# Patient Record
Sex: Male | Born: 1985 | Race: Asian | Hispanic: No | Marital: Single | Smoking: Never smoker
Health system: Southern US, Community
[De-identification: ages and names within clinical notes are randomized; demographics above are authoritative.]

## PROBLEM LIST (undated history)

## (undated) HISTORY — PX: PERIPHERAL ARTERIAL STENT GRAFT: SHX2220

---

## 2005-03-07 ENCOUNTER — Emergency Department (HOSPITAL_COMMUNITY): Admission: EM | Admit: 2005-03-07 | Discharge: 2005-03-07 | Payer: Self-pay | Admitting: Emergency Medicine

## 2007-06-14 ENCOUNTER — Emergency Department (HOSPITAL_COMMUNITY): Admission: EM | Admit: 2007-06-14 | Discharge: 2007-06-14 | Payer: Self-pay | Admitting: Emergency Medicine

## 2016-06-08 ENCOUNTER — Emergency Department (HOSPITAL_COMMUNITY)
Admission: EM | Admit: 2016-06-08 | Discharge: 2016-06-09 | Disposition: A | Discharge status: Home / Self Care | Payer: BLUE CROSS/BLUE SHIELD | Attending: Emergency Medicine | Admitting: Emergency Medicine

## 2016-06-08 DIAGNOSIS — F10120 Alcohol abuse with intoxication, uncomplicated: Secondary | ICD-10-CM | POA: Diagnosis not present

## 2016-06-08 DIAGNOSIS — S0240EA Zygomatic fracture, right side, initial encounter for closed fracture: Secondary | ICD-10-CM | POA: Diagnosis not present

## 2016-06-08 DIAGNOSIS — S0993XA Unspecified injury of face, initial encounter: Secondary | ICD-10-CM | POA: Diagnosis present

## 2016-06-08 DIAGNOSIS — R791 Abnormal coagulation profile: Secondary | ICD-10-CM | POA: Diagnosis not present

## 2016-06-08 DIAGNOSIS — N1330 Unspecified hydronephrosis: Secondary | ICD-10-CM

## 2016-06-08 DIAGNOSIS — Y939 Activity, unspecified: Secondary | ICD-10-CM | POA: Diagnosis not present

## 2016-06-08 DIAGNOSIS — S0990XA Unspecified injury of head, initial encounter: Secondary | ICD-10-CM | POA: Insufficient documentation

## 2016-06-08 DIAGNOSIS — Z23 Encounter for immunization: Secondary | ICD-10-CM | POA: Diagnosis not present

## 2016-06-08 DIAGNOSIS — Y999 Unspecified external cause status: Secondary | ICD-10-CM | POA: Insufficient documentation

## 2016-06-08 DIAGNOSIS — S0181XA Laceration without foreign body of other part of head, initial encounter: Secondary | ICD-10-CM

## 2016-06-08 DIAGNOSIS — T1490XA Injury, unspecified, initial encounter: Secondary | ICD-10-CM

## 2016-06-08 DIAGNOSIS — F10920 Alcohol use, unspecified with intoxication, uncomplicated: Secondary | ICD-10-CM

## 2016-06-08 DIAGNOSIS — S01111A Laceration without foreign body of right eyelid and periocular area, initial encounter: Secondary | ICD-10-CM | POA: Insufficient documentation

## 2016-06-08 DIAGNOSIS — Y92410 Unspecified street and highway as the place of occurrence of the external cause: Secondary | ICD-10-CM | POA: Insufficient documentation
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
## 2016-06-09 ENCOUNTER — Emergency Department (HOSPITAL_COMMUNITY): Payer: BLUE CROSS/BLUE SHIELD

## 2016-06-09 ENCOUNTER — Encounter (HOSPITAL_COMMUNITY): Payer: Self-pay

## 2016-06-09 DIAGNOSIS — S0240EA Zygomatic fracture, right side, initial encounter for closed fracture: Secondary | ICD-10-CM | POA: Diagnosis not present

## 2016-06-09 LAB — COMPREHENSIVE METABOLIC PANEL
ALBUMIN: 4.7 g/dL (ref 3.5–5.0)
ALT: 23 U/L (ref 17–63)
AST: 26 U/L (ref 15–41)
Alkaline Phosphatase: 73 U/L (ref 38–126)
Anion gap: 10 (ref 5–15)
BUN: 10 mg/dL (ref 6–20)
CHLORIDE: 109 mmol/L (ref 101–111)
CO2: 22 mmol/L (ref 22–32)
Calcium: 9.3 mg/dL (ref 8.9–10.3)
Creatinine, Ser: 1 mg/dL (ref 0.61–1.24)
GFR calc Af Amer: >60 mL/min (ref >60)
GLUCOSE: 110 mg/dL — AB (ref 65–99)
POTASSIUM: 3.4 mmol/L — AB (ref 3.5–5.1)
SODIUM: 141 mmol/L (ref 135–145)
Total Bilirubin: 0.5 mg/dL (ref 0.3–1.2)
Total Protein: 7.4 g/dL (ref 6.5–8.1)

## 2016-06-09 LAB — I-STAT CHEM 8, ED
BUN: 12 mg/dL (ref 6–20)
Calcium, Ion: 1.02 mmol/L — ABNORMAL LOW (ref 1.15–1.40)
Chloride: 107 mmol/L (ref 101–111)
Creatinine, Ser: 1.2 mg/dL (ref 0.61–1.24)
Glucose, Bld: 107 mg/dL — ABNORMAL HIGH (ref 65–99)
HEMATOCRIT: 50 % (ref 39.0–52.0)
HEMOGLOBIN: 17 g/dL (ref 13.0–17.0)
Potassium: 3.4 mmol/L — ABNORMAL LOW (ref 3.5–5.1)
SODIUM: 144 mmol/L (ref 135–145)
TCO2: 23 mmol/L (ref 0–100)

## 2016-06-09 LAB — PROTIME-INR
INR: 0.92
Prothrombin Time: 12.3 seconds (ref 11.4–15.2)

## 2016-06-09 LAB — CBC
HEMATOCRIT: 48.8 % (ref 39.0–52.0)
Hemoglobin: 16.3 g/dL (ref 13.0–17.0)
MCH: 28.7 pg (ref 26.0–34.0)
MCHC: 33.4 g/dL (ref 30.0–36.0)
MCV: 85.9 fL (ref 78.0–100.0)
Platelets: 254 10*3/uL (ref 150–400)
RBC: 5.68 MIL/uL (ref 4.22–5.81)
RDW: 13.5 % (ref 11.5–15.5)
WBC: 9.4 10*3/uL (ref 4.0–10.5)

## 2016-06-09 LAB — SAMPLE TO BLOOD BANK

## 2016-06-09 LAB — ETHANOL: Alcohol, Ethyl (B): 201 mg/dL — ABNORMAL HIGH (ref <5)

## 2016-06-09 MED ORDER — TETANUS-DIPHTH-ACELL PERTUSSIS 5-2.5-18.5 LF-MCG/0.5 IM SUSP
0.5000 mL | Freq: Once | INTRAMUSCULAR | Status: AC
  Administered 2016-06-09: 0.5 mL via INTRAMUSCULAR

## 2016-06-09 MED ORDER — LIDOCAINE-EPINEPHRINE (PF) 2 %-1:200000 IJ SOLN
10.0000 mL | Freq: Once | INTRAMUSCULAR | Status: AC
  Administered 2016-06-09: 10 mL
  Filled 2016-06-09: qty 20

## 2016-06-09 MED ORDER — TETANUS-DIPHTH-ACELL PERTUSSIS 5-2.5-18.5 LF-MCG/0.5 IM SUSP
INTRAMUSCULAR | Status: AC
  Filled 2016-06-09: qty 0.5

## 2016-06-09 MED ORDER — HYDROCODONE-ACETAMINOPHEN 5-325 MG PO TABS: 1.0000 | ORAL_TABLET | ORAL | 0 refills | Status: DC | PRN

## 2016-06-09 MED ORDER — SODIUM CHLORIDE 0.9 % IV BOLUS (SEPSIS)
500.0000 mL | Freq: Once | INTRAVENOUS | Status: AC
  Administered 2016-06-09: 500 mL via INTRAVENOUS

## 2016-06-09 MED ORDER — SODIUM CHLORIDE 0.9 % IV BOLUS (SEPSIS)
125.0000 mL | Freq: Once | INTRAVENOUS | Status: AC
  Administered 2016-06-09: 125 mL via INTRAVENOUS

## 2016-06-09 MED ORDER — IOPAMIDOL (ISOVUE-300) INJECTION 61%
INTRAVENOUS | Status: AC
  Administered 2016-06-09: 100 mL
  Filled 2016-06-09: qty 100

## 2016-06-09 NOTE — Progress Notes (Signed)
Responded to page to Ed. Sat with pt's mom, who spoke mainly [place], and neighbor who interpreted. Pt is mom's only child, and she was quite concerned about him. Another young man joined interpreter later. Provided emotional/spiritual support to all. Chaplain available for follow-up.    06/09/16 0700  Clinical Encounter Type  Visited With Patient and family together  Visit Type Initial;Spiritual support;ED  Referral From Nurse  Spiritual Encounters  Spiritual Needs Emotional  Stress Factors  Patient Stress Factors Health changes  Family Stress Factors Family relationships;Health changes

## 2016-06-09 NOTE — ED Provider Notes (Signed)
..  Laceration Repair Date/Time: 06/09/2016 3:06 AM Performed by: Dierdre ForthMUTHERSBAUGH, Haasini Patnaude Authorized by: Dierdre ForthMUTHERSBAUGH, Shyenne Maggard   Consent:    Consent obtained:  Verbal   Consent given by:  Patient   Risks discussed:  Infection and pain   Alternatives discussed:  No treatment and observation Anesthesia (see MAR for exact dosages):    Anesthesia method:  Local infiltration   Local anesthetic:  Lidocaine 2% WITH epi Laceration details:    Location:  Face   Face location:  R eyebrow   Length (cm):  3.5 Repair type:    Repair type:  Simple Exploration:    Hemostasis achieved with:  Direct pressure and epinephrine   Wound exploration: entire depth of wound probed and visualized     Contaminated: yes   Treatment:    Area cleansed with:  Betadine and saline   Amount of cleaning:  Standard   Irrigation solution:  Sterile water   Irrigation method:  Syringe Skin repair:    Repair method:  Sutures   Suture size:  5-0   Wound skin closure material used: vicryl rapide.   Suture technique:  Simple interrupted   Number of sutures:  4 Approximation:    Approximation:  Close   Vermilion border: well-aligned   Post-procedure details:    Dressing:  Open (no dressing)   Patient tolerance of procedure:  Tolerated well, no immediate complications      Dierdre ForthHannah Marrion Accomando, PA-C 06/09/16 [id number]    Linwood DibblesJon Knapp, MD 06/09/16 [phone]

## 2016-06-09 NOTE — ED Notes (Signed)
Pt transports to CT  

## 2016-06-09 NOTE — ED Notes (Signed)
Per ENT pt may go home
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
## 2016-06-09 NOTE — ED Notes (Signed)
Family updated as to patient's status.
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
## 2023-02-26 DIAGNOSIS — S51811A Laceration without foreign body of right forearm, initial encounter: Secondary | ICD-10-CM | POA: Diagnosis not present

## 2023-02-27 DIAGNOSIS — S66122A Laceration of flexor muscle, fascia and tendon of right middle finger at wrist and hand level, initial encounter: Secondary | ICD-10-CM | POA: Diagnosis not present

## 2023-02-27 DIAGNOSIS — S66124A Laceration of flexor muscle, fascia and tendon of right ring finger at wrist and hand level, initial encounter: Secondary | ICD-10-CM | POA: Diagnosis not present

## 2023-02-27 DIAGNOSIS — S66126A Laceration of flexor muscle, fascia and tendon of right little finger at wrist and hand level, initial encounter: Secondary | ICD-10-CM | POA: Diagnosis not present

## 2023-02-27 DIAGNOSIS — S66128A Laceration of flexor muscle, fascia and tendon of other finger at wrist and hand level, initial encounter: Secondary | ICD-10-CM | POA: Diagnosis not present

## 2023-02-27 DIAGNOSIS — S66120A Laceration of flexor muscle, fascia and tendon of right index finger at wrist and hand level, initial encounter: Secondary | ICD-10-CM | POA: Diagnosis not present

## 2023-02-27 DIAGNOSIS — I82621 Acute embolism and thrombosis of deep veins of right upper extremity: Secondary | ICD-10-CM | POA: Diagnosis not present

## 2023-02-27 DIAGNOSIS — S65111S Laceration of radial artery at wrist and hand level of right arm, sequela: Secondary | ICD-10-CM | POA: Diagnosis not present

## 2023-04-24 DIAGNOSIS — M79641 Pain in right hand: Secondary | ICD-10-CM | POA: Diagnosis not present

## 2023-04-29 DIAGNOSIS — M79641 Pain in right hand: Secondary | ICD-10-CM | POA: Diagnosis not present

## 2023-05-01 DIAGNOSIS — M79641 Pain in right hand: Secondary | ICD-10-CM | POA: Diagnosis not present

## 2023-05-06 DIAGNOSIS — M79641 Pain in right hand: Secondary | ICD-10-CM | POA: Diagnosis not present

## 2023-05-08 DIAGNOSIS — M79641 Pain in right hand: Secondary | ICD-10-CM | POA: Diagnosis not present

## 2023-05-15 DIAGNOSIS — M79641 Pain in right hand: Secondary | ICD-10-CM | POA: Diagnosis not present

## 2023-07-20 ENCOUNTER — Other Ambulatory Visit: Payer: Self-pay

## 2023-07-20 ENCOUNTER — Emergency Department (HOSPITAL_COMMUNITY)
Admission: EM | Admit: 2023-07-20 | Discharge: 2023-07-20 | Disposition: A | Discharge status: Home / Self Care | Payer: Medicaid Other | Attending: Emergency Medicine | Admitting: Emergency Medicine

## 2023-07-20 ENCOUNTER — Emergency Department (HOSPITAL_COMMUNITY): Payer: Medicaid Other

## 2023-07-20 ENCOUNTER — Encounter (HOSPITAL_COMMUNITY): Payer: Self-pay | Admitting: *Deleted

## 2023-07-20 DIAGNOSIS — D72829 Elevated white blood cell count, unspecified: Secondary | ICD-10-CM | POA: Diagnosis not present

## 2023-07-20 DIAGNOSIS — R051 Acute cough: Secondary | ICD-10-CM | POA: Diagnosis not present

## 2023-07-20 DIAGNOSIS — R059 Cough, unspecified: Secondary | ICD-10-CM | POA: Diagnosis present

## 2023-07-20 DIAGNOSIS — R079 Chest pain, unspecified: Secondary | ICD-10-CM | POA: Diagnosis not present

## 2023-07-20 DIAGNOSIS — R0981 Nasal congestion: Secondary | ICD-10-CM | POA: Diagnosis not present

## 2023-07-20 LAB — BASIC METABOLIC PANEL
Anion gap: 7 (ref 5–15)
BUN: 10 mg/dL (ref 6–20)
CO2: 27 mmol/L (ref 22–32)
Calcium: 8.7 mg/dL — ABNORMAL LOW (ref 8.9–10.3)
Chloride: 107 mmol/L (ref 98–111)
Creatinine, Ser: 1.23 mg/dL (ref 0.61–1.24)
GFR, Estimated: >60 mL/min (ref >60)
Glucose, Bld: 93 mg/dL (ref 70–99)
Potassium: 3.5 mmol/L (ref 3.5–5.1)
Sodium: 141 mmol/L (ref 135–145)

## 2023-07-20 LAB — TROPONIN I (HIGH SENSITIVITY): Troponin I (High Sensitivity): 6 ng/L (ref <18)

## 2023-07-20 LAB — CBC
HCT: 45.4 % (ref 39.0–52.0)
Hemoglobin: 14.6 g/dL (ref 13.0–17.0)
MCH: 26.4 pg (ref 26.0–34.0)
MCHC: 32.2 g/dL (ref 30.0–36.0)
MCV: 82.2 fL (ref 80.0–100.0)
Platelets: 307 10*3/uL (ref 150–400)
RBC: 5.52 MIL/uL (ref 4.22–5.81)
RDW: 14.9 % (ref 11.5–15.5)
WBC: 11.7 10*3/uL — ABNORMAL HIGH (ref 4.0–10.5)
nRBC: 0 % (ref 0.0–0.2)

## 2023-07-20 MED ORDER — ALBUTEROL SULFATE HFA 108 (90 BASE) MCG/ACT IN AERS: 1.0000 | INHALATION_SPRAY | Freq: Four times a day (QID) | RESPIRATORY_TRACT | 0 refills | Status: DC | PRN

## 2023-07-20 MED ORDER — FLUTICASONE PROPIONATE 50 MCG/ACT NA SUSP: 2.0000 | Freq: Every day | NASAL | 2 refills | Status: DC

## 2023-07-20 NOTE — Discharge Instructions (Addendum)
Today you were seen for an upper respiratory infection.  Please pick up your medications and take as prescribed.  Please follow-up with your PCP if your symptoms persist for further evaluation and treatment.  Thank you for letting us treat you today. After reviewing your labs and imaging, I feel you are safe to go home. Please follow up with your PCP in the next several days and provide them with your records from this visit. Return to the Emergency Room if pain becomes severe or symptoms worsen.

## 2023-07-20 NOTE — ED Provider Notes (Signed)
[place] EMERGENCY DEPARTMENT AT [facility] Provider Note   CSN: [id number] Arrival date & time: 07/20/23  1821     History  Chief Complaint  Patient presents with   Chest Pain    Willie Riley is a 37 y.o. male no significant past medical history presents today for cough and congestion x 1 week.  Patient states that he was seen last week at urgent care and tested negative for COVID, flu, RSV, strep.  He was given a steroid taper and cough medication with minimal relief.  Patient denies fever, shortness of breath, chills, body aches, abdominal pain, nausea, vomiting, or diarrhea   Chest Pain Associated symptoms: cough        Home Medications Prior to Admission medications   Medication Sig Start Date End Date Taking? Authorizing Provider  albuterol (VENTOLIN HFA) 108 (90 Base) MCG/ACT inhaler Inhale 1-2 puffs into the lungs every 6 (six) hours as needed for wheezing or shortness of breath. 07/20/23  Yes Dolphus Jenny, PA-C  fluticasone (FLONASE) 50 MCG/ACT nasal spray Place 2 sprays into both nostrils daily. 07/20/23  Yes Dolphus Jenny, PA-C  HYDROcodone-acetaminophen (NORCO/VICODIN) 5-325 MG tablet Take 1 tablet by mouth every 4 (four) hours as needed. 06/09/16   Linwood Dibbles, MD      Allergies    Patient has no known allergies.    Review of Systems   Review of Systems  HENT:  Positive for congestion.   Respiratory:  Positive for cough.     Physical Exam Updated Vital Signs BP (!) 169/112   Pulse 98   Temp 97.7 F (36.5 C) (Oral)   Resp 18   Ht 5\' 6"  (1.676 m)   Wt 59 kg   SpO2 98%   BMI 20.99 kg/m  Physical Exam Vitals and nursing note reviewed.  Constitutional:      General: He is not in acute distress.    Appearance: He is well-developed.  HENT:     Head: Normocephalic and atraumatic.     Right Ear: External ear normal.     Left Ear: External ear normal.     Nose: Congestion present.     Mouth/Throat:     Mouth: Mucous membranes are moist.      Tonsils: No tonsillar exudate or tonsillar abscesses.  Eyes:     Extraocular Movements: Extraocular movements intact.     Conjunctiva/sclera: Conjunctivae normal.  Cardiovascular:     Rate and Rhythm: Normal rate and regular rhythm.     Heart sounds: No murmur heard. Pulmonary:     Effort: Pulmonary effort is normal. No respiratory distress.     Breath sounds: Normal breath sounds.  Abdominal:     Palpations: Abdomen is soft.     Tenderness: There is no abdominal tenderness.  Musculoskeletal:        General: No swelling.     Cervical back: Neck supple.  Skin:    General: Skin is warm and dry.     Capillary Refill: Capillary refill takes less than 2 seconds.  Neurological:     General: No focal deficit present.     Mental Status: He is alert.  Psychiatric:        Mood and Affect: Mood normal.     ED Results / Procedures / Treatments   Labs (all labs ordered are listed, but only abnormal results are displayed) Labs Reviewed  BASIC METABOLIC PANEL - Abnormal; Notable for the following components:      Result  Value   Calcium 8.7 (*)    All other components within normal limits  CBC - Abnormal; Notable for the following components:   WBC 11.7 (*)    All other components within normal limits  TROPONIN I (HIGH SENSITIVITY)  TROPONIN I (HIGH SENSITIVITY)    EKG None  Radiology DG Chest 2 View  Result Date: 07/20/2023 CLINICAL DATA:  Cough, chest pain. EXAM: CHEST - 2 VIEW COMPARISON:  June 08, 2016. FINDINGS: The heart size and mediastinal contours are within normal limits. Both lungs are clear. The visualized skeletal structures are unremarkable. IMPRESSION: No active cardiopulmonary disease. Electronically Signed   By: Lupita Raider M.D.   On: 07/20/2023 19:11    Procedures Procedures    Medications Ordered in ED Medications - No data to display  ED Course/ Medical Decision Making/ A&P                                 Medical Decision Making Amount  and/or Complexity of Data Reviewed Labs: ordered. Radiology: ordered.  Risk Prescription drug management.   This patient presents to the ED with chief complaint(s) of cough and congestion with pertinent past medical history of none which further complicates the presenting complaint. The complaint involves an extensive differential diagnosis and also carries with it a high risk of complications and morbidity.    The differential diagnosis includes upper respiratory infection, bronchitis, pneumonia  Additional history obtained: Additional history obtained from family  ED Course and Reassessment:   Independent labs interpretation:  The following labs were independently interpreted:  CBC: Leukocytosis at 11.7 BMP: mild hypocalcemia Troponin: 6 EKG: Normal sinus rhythm  Independent visualization of imaging: - I independently visualized the following imaging with scope of interpretation limited to determining acute life threatening conditions related to emergency care: Chest x-ray, which revealed no active cardiopulmonary disease  Consultation: - Consulted or discussed management/test interpretation w/ external professional: None  Consideration for admission or further workup: Considered for admission or further workup however patient's vital signs, physical exam, labs, and imaging have all been reassuring.  Patient likely has upper respiratory infection or bronchitis.  Patient will be treated outpatient with Flonase for nasal congestion and albuterol inhaler as needed.         Final Clinical Impression(s) / ED Diagnoses Final diagnoses:  Acute cough    Rx / DC Orders ED Discharge Orders          Ordered    fluticasone (FLONASE) 50 MCG/ACT nasal spray  Daily        07/20/23 2113    albuterol (VENTOLIN HFA) 108 (90 Base) MCG/ACT inhaler  Every 6 hours PRN        07/20/23 2113              Dolphus Jenny, PA-C 07/20/23 2122    Melene Plan, DO 07/20/23 2126

## 2023-07-20 NOTE — ED Triage Notes (Signed)
The ucc did a covid test and flu tests that were negative

## 2023-07-20 NOTE — ED Triage Notes (Signed)
The pt is c/o a cold chest congestion for one week he was seen at  [facility] last week and he was given some meds he is no better

## 2023-08-25 ENCOUNTER — Encounter: Payer: Self-pay | Admitting: *Deleted

## 2023-08-25 ENCOUNTER — Emergency Department: Payer: Medicaid Other

## 2023-08-25 ENCOUNTER — Other Ambulatory Visit: Payer: Self-pay

## 2023-08-25 DIAGNOSIS — R059 Cough, unspecified: Secondary | ICD-10-CM | POA: Diagnosis present

## 2023-08-25 DIAGNOSIS — Z720 Tobacco use: Secondary | ICD-10-CM | POA: Diagnosis not present

## 2023-08-25 DIAGNOSIS — Z20822 Contact with and (suspected) exposure to covid-19: Secondary | ICD-10-CM | POA: Diagnosis not present

## 2023-08-25 DIAGNOSIS — R052 Subacute cough: Secondary | ICD-10-CM | POA: Insufficient documentation

## 2023-08-25 LAB — RESP PANEL BY RT-PCR (RSV, FLU A&B, COVID)  RVPGX2
Influenza A by PCR: NEGATIVE
Influenza B by PCR: NEGATIVE
Resp Syncytial Virus by PCR: NEGATIVE
SARS Coronavirus 2 by RT PCR: NEGATIVE

## 2023-08-25 NOTE — ED Triage Notes (Addendum)
 Pt ambulatory to triage.  Pt has a cough for 1 month.  No fever.  Pt taking otc meds without relief.  Pt alert.  Speech clear.  Pt states he ran out of inhaler 2 days ago.

## 2023-08-26 ENCOUNTER — Emergency Department
Admission: EM | Admit: 2023-08-26 | Discharge: 2023-08-26 | Disposition: A | Discharge status: Home / Self Care | Payer: Medicaid Other | Attending: Emergency Medicine | Admitting: Emergency Medicine

## 2023-08-26 DIAGNOSIS — R052 Subacute cough: Secondary | ICD-10-CM

## 2023-08-26 MED ORDER — HYDROCOD POLI-CHLORPHE POLI ER 10-8 MG/5ML PO SUER
5.0000 mL | Freq: Once | ORAL | Status: AC
  Administered 2023-08-26: 5 mL via ORAL
  Filled 2023-08-26: qty 5

## 2023-08-26 MED ORDER — ALBUTEROL SULFATE HFA 108 (90 BASE) MCG/ACT IN AERS: 1.0000 | INHALATION_SPRAY | RESPIRATORY_TRACT | 1 refills | Status: DC | PRN

## 2023-08-26 MED ORDER — HYDROCOD POLI-CHLORPHE POLI ER 10-8 MG/5ML PO SUER: 5.0000 mL | Freq: Two times a day (BID) | ORAL | 0 refills | Status: DC | PRN

## 2023-08-26 NOTE — ED Provider Notes (Signed)
 [facility] Provider Note    Event Date/Time   First MD Initiated Contact with Patient 08/26/23 0018     (approximate)   History   Cough   HPI Willie Riley is a 38 y.o. male with no known medical issues but who has a tobacco use history though ASAP smoking about 5 months ago.  He presents for evaluation of persistent cough that has been going on about 2 months.  He said that it seems little bit worse recently and today he went jogging but had multiple coughing spells afterwards.  He does not have a primary care physician or pulmonologist.  He has no diagnoses in particular.  He has been to an urgent care a couple of times and went to the [facility] emergency department about a month ago and was told there was nothing in particular that was wrong.  He and his wife are frustrated because the symptoms have persisted.  He denies fever, chills, nausea, vomiting, chest pain, abdominal pain.  The main symptom is the cough which is worse at night.  He said that he takes Mucinex regularly but is unclear if it helps.  He was given a course of prednisone at the urgent care but he does not think that made a difference.  The albuterol  inhaler that he was prescribed did help but he has run out.     Physical Exam   Triage Vital Signs: ED Triage Vitals  Encounter Vitals Group     BP 08/25/23 2134 (!) 141/111     Systolic BP Percentile --      Diastolic BP Percentile --      Pulse Rate 08/25/23 2134 (!) 102     Resp 08/25/23 2134 18     Temp 08/25/23 2134 97.8 F (36.6 C)     Temp Source 08/26/23 0058 Oral     SpO2 08/25/23 2134 98 %     Weight 08/25/23 2133 70.3 kg (155 lb)     Height 08/25/23 2133 1.676 m (5' 6)     Head Circumference --      Peak Flow --      Pain Score 08/25/23 2133 0     Pain Loc --      Pain Education --      Exclude from Growth Chart --     Most recent vital signs: Vitals:   08/25/23 2134 08/26/23 0058  BP: (!) 141/111 (!) 143/86   Pulse: (!) 102 68  Resp: 18 19  Temp: 97.8 F (36.6 C) 97.6 F (36.4 C)  SpO2: 98% 98%    General: Awake, no distress.  Generally well-appearing. CV:  Good peripheral perfusion.  Regular rate and rhythm.  Normal heart sounds. Resp:  Normal effort. Speaking easily and comfortably, no accessory muscle usage nor intercostal retractions.  Lungs are clear to auscultation with no wheezes or rhonchi, but the patient does have a frequent thick sounding cough. Abd:  No distention.    ED Results / Procedures / Treatments   Labs (all labs ordered are listed, but only abnormal results are displayed) Labs Reviewed  RESP PANEL BY RT-PCR (RSV, FLU A&B, COVID)  [id number]     RADIOLOGY I viewed and interpreted the patient's two-view chest x-ray and I see no evidence of pneumonia or viral pattern.  I also read the radiologist's report, which confirmed no acute findings.   PROCEDURES:  Critical Care performed: No  Procedures    IMPRESSION / MDM / ASSESSMENT  AND PLAN / ED COURSE  I reviewed the triage vital signs and the nursing notes.                              Differential diagnosis includes, but is not limited to, viral illness, undiagnosed COPD, undiagnosed asthma, acute on chronic bronchitis, pneumonia.  Patient's presentation is most consistent with acute presentation with potential threat to life or bodily function.  Labs/studies ordered: Respiratory viral panel, two-view chest x-ray  Interventions/Medications given:  Medications  chlorpheniramine-HYDROcodone  (TUSSIONEX) 10-8 MG/5ML suspension 5 mL (5 mLs Oral Given 08/26/23 0141)    (Note:  hospital course my include additional interventions and/or labs/studies not listed above.)   Patient is well-appearing and in no obvious distress although he does have an occasional thick sounding cough.  Vital signs are stable and within normal limits with no hypoxia or tachycardia.  Symptoms are most consistent with possibly a mild COPD  that has been previously undiagnosed.  Patient has no primary care currently although his wife reports that he has an appointment in a couple of weeks.  His workup tonight is reassuring.  The only thing that has worked for him is an albuterol  inhaler so I will refill the inhaler and encouraged him to follow-up as an outpatient.  I also checked the [place]  controlled substance database and there are no concerning prescribing habits I will write him a prescription for Tussidex and instructed him to take it or the Mucinex but not both.  The patient and his wife understand and agree with the plan.      FINAL CLINICAL IMPRESSION(S) / ED DIAGNOSES   Final diagnoses:  Subacute cough     Rx / DC Orders   ED Discharge Orders          Ordered    chlorpheniramine-HYDROcodone  (TUSSIONEX) 10-8 MG/5ML  Every 12 hours PRN        08/26/23 0217    albuterol  (VENTOLIN  HFA) 108 (90 Base) MCG/ACT inhaler  Every 4 hours PRN        08/26/23 0217             Note:  This document was prepared using Dragon voice recognition software and may include unintentional dictation errors.   Gordan Huxley, MD 08/26/23 [phone]

## 2023-08-26 NOTE — Discharge Instructions (Signed)
 Your workup in the Emergency Department today was reassuring.  We did not find any specific abnormalities.  We recommend you drink plenty of fluids, take your regular medications and/or any new ones prescribed today, and follow up with the doctor(s) listed in these documents as recommended.  Return to the Emergency Department if you develop new or worsening symptoms that concern you.

## 2023-08-26 NOTE — ED Notes (Signed)
 ED Provider at bedside.

## 2023-09-17 ENCOUNTER — Encounter: Payer: Self-pay | Admitting: Family Medicine

## 2023-09-17 ENCOUNTER — Ambulatory Visit: Payer: Medicaid Other | Admitting: Family Medicine

## 2023-09-17 VITALS — BP 143/88 | HR 85 | Resp 18 | Ht 66.0 in | Wt 159.4 lb

## 2023-09-17 DIAGNOSIS — S55001D Unspecified injury of ulnar artery at forearm level, right arm, subsequent encounter: Secondary | ICD-10-CM

## 2023-09-17 DIAGNOSIS — S61501A Unspecified open wound of right wrist, initial encounter: Secondary | ICD-10-CM | POA: Insufficient documentation

## 2023-09-17 DIAGNOSIS — S41111D Laceration without foreign body of right upper arm, subsequent encounter: Secondary | ICD-10-CM | POA: Diagnosis not present

## 2023-09-17 DIAGNOSIS — R053 Chronic cough: Secondary | ICD-10-CM | POA: Diagnosis not present

## 2023-09-17 DIAGNOSIS — Z7689 Persons encountering health services in other specified circumstances: Secondary | ICD-10-CM | POA: Insufficient documentation

## 2023-09-17 DIAGNOSIS — S55101D Unspecified injury of radial artery at forearm level, right arm, subsequent encounter: Secondary | ICD-10-CM | POA: Diagnosis not present

## 2023-09-17 DIAGNOSIS — J209 Acute bronchitis, unspecified: Secondary | ICD-10-CM | POA: Diagnosis not present

## 2023-09-17 DIAGNOSIS — Z0000 Encounter for general adult medical examination without abnormal findings: Secondary | ICD-10-CM | POA: Insufficient documentation

## 2023-09-17 DIAGNOSIS — S61501D Unspecified open wound of right wrist, subsequent encounter: Secondary | ICD-10-CM | POA: Diagnosis not present

## 2023-09-17 DIAGNOSIS — S66921D Laceration of unspecified muscle, fascia and tendon at wrist and hand level, right hand, subsequent encounter: Secondary | ICD-10-CM | POA: Diagnosis not present

## 2023-09-17 DIAGNOSIS — I10 Essential (primary) hypertension: Secondary | ICD-10-CM | POA: Diagnosis not present

## 2023-09-17 MED ORDER — LOSARTAN POTASSIUM 25 MG PO TABS: 25.0000 mg | ORAL_TABLET | Freq: Every day | ORAL | 1 refills | Status: DC

## 2023-09-17 MED ORDER — BUDESONIDE-FORMOTEROL FUMARATE 80-4.5 MCG/ACT IN AERO: 2.0000 | INHALATION_SPRAY | Freq: Two times a day (BID) | RESPIRATORY_TRACT | 12 refills | Status: AC

## 2023-09-17 MED ORDER — ALBUTEROL SULFATE HFA 108 (90 BASE) MCG/ACT IN AERS: 1.0000 | INHALATION_SPRAY | RESPIRATORY_TRACT | 1 refills | Status: AC | PRN

## 2023-09-17 NOTE — Assessment & Plan Note (Signed)
Discussion about influenza vaccination. Has not received flu shot this season. Advised that flu shot can help prevent influenza, especially important given bronchitis history. - Recommend influenza vaccination once bronchitis symptoms improve

## 2023-09-17 NOTE — Assessment & Plan Note (Signed)
Addressed as noted above.

## 2023-09-17 NOTE — Assessment & Plan Note (Addendum)
Small lump in the right wrist distal and adjacent to the surgical incision, which was first noticed after resuming gym workouts. No pain or functional limitation. Likely related to healing from surgery six months ago. Advised monitoring for changes or worsening symptoms. - Monitor for changes or worsening symptoms - Refer to a local specialist per patient request due to excessive travel time to follow up with original surgeon.

## 2023-09-17 NOTE — Assessment & Plan Note (Signed)
Ongoing elevated blood pressure since the accident. No prior hypertension management. Decision to start medication due to persistent high readings. Discussed importance of home monitoring and benefits of losartan in managing hypertension, reducing stroke and heart attack risk. Advised to monitor for side effects such as dizziness or lightheadedness. - Start losartan - Follow up in four weeks to monitor blood pressure - Advise to obtain a blood pressure cuff and keep a daily log - Bring blood pressure cuff to next appointment for calibration

## 2023-09-17 NOTE — Assessment & Plan Note (Signed)
Persistent cough since before Thanksgiving, initially diagnosed as bronchitis. Symptoms include morning cough, occasional night awakenings, and frequent albuterol use. Considering differential diagnosis of asthma or COPD. Discussed benefits of pulmonary function test and methacholine challenge for asthma diagnosis. Explained methacholine challenge involves inhaling medication to trigger asthma symptoms. Discussed Symbicort inhaler to reduce symptoms and importance of albuterol for exercise-induced symptoms. - Order pulmonary function test - Order methacholine challenge - Refill albuterol inhaler - Prescribe Symbicort inhaler (budesonide/formoterol) for twice daily use - Advise to keep albuterol available for exercise-induced symptoms

## 2023-09-17 NOTE — Progress Notes (Signed)
New patient visit   Patient: Willie Riley   DOB: 1986-01-25   38 y.o. Male  MRN: [id number] Visit Date: 09/17/2023  Today's healthcare provider: Sherlyn Hay, DO   Chief Complaint  Patient presents with   Establish Care   Subjective    Willie Riley is a 38 y.o. male who presents today as a new patient to establish care.  HPI HPI   Concerns about persistent elevated blood pressures Last edited by Ashok Cordia, CMA on 09/17/2023  2:06 PM.     The patient presents with concerns about blood pressure and ongoing respiratory symptoms. He is accompanied by his girlfriend.  He has experienced elevated blood pressure since a work accident, though it is unclear if it was elevated prior as it was not regularly monitored. He is not on any antihypertensive medication.  He has had persistent respiratory symptoms, including a cough and shortness of breath, for approximately three months. He was diagnosed with bronchitis in the emergency room and uses albuterol several times a day, especially in the morning and after physical activity. The cough is worse in the morning and sometimes wakes him up two to three times a week. He has used fluticasone nasal spray with minimal relief. He experienced shortness of breath while running at the gym, leading to another emergency room visit. He has no known history of asthma but has a history of smoking, which he has quit. He is concerned about developing asthma or COPD. No chest pain but reports mucus in his chest, sneezing, and a morning cough. No known allergies.  He underwent surgery on his right arm following a work accident, with a stent placed in one or two arteries. He takes aspirin for this condition. He has noticed swelling in his hand, particularly after gym workouts, but it does not cause pain or interfere with activities. The swelling was not present before resuming physical activity post-surgery six months ago.  He is physically active, going to  the gym for workouts.    History reviewed. No pertinent past medical history. Past Surgical History:  Procedure Laterality Date   PERIPHERAL ARTERIAL STENT GRAFT Right    No family status information on file.   History reviewed. No pertinent family history. Social History   Socioeconomic History   Marital status: Single    Spouse name: Not on file   Number of children: Not on file   Years of education: Not on file   Highest education level: Not on file  Occupational History   Not on file  Tobacco Use   Smoking status: Former    Types: Cigarettes   Smokeless tobacco: Never  Vaping Use   Vaping status: Never Used  Substance and Sexual Activity   Alcohol use: Yes   Drug use: Never   Sexual activity: Not on file  Other Topics Concern   Not on file  Social History Narrative   Not on file   Social Drivers of Health   Financial Resource Strain: Not on file  Food Insecurity: Not on file  Transportation Needs: Not on file  Physical Activity: Not on file  Stress: Not on file  Social Connections: Not on file   Outpatient Medications Prior to Visit  Medication Sig   aspirin EC 81 MG tablet Take 81 mg by mouth daily. Swallow whole.   [DISCONTINUED] albuterol (VENTOLIN HFA) 108 (90 Base) MCG/ACT inhaler Inhale 1-2 puffs into the lungs every 4 (four) hours as needed for wheezing or shortness  of breath.   [DISCONTINUED] chlorpheniramine-HYDROcodone (TUSSIONEX) 10-8 MG/5ML Take 5 mLs by mouth every 12 (twelve) hours as needed for cough.   [DISCONTINUED] fluticasone (FLONASE) 50 MCG/ACT nasal spray Place 2 sprays into both nostrils daily.   [DISCONTINUED] HYDROcodone-acetaminophen (NORCO/VICODIN) 5-325 MG tablet Take 1 tablet by mouth every 4 (four) hours as needed.   No facility-administered medications prior to visit.   No Known Allergies  Immunization History  Administered Date(s) Administered   Tdap 06/09/2016    Health Maintenance  Topic Date Due   HIV Screening   Never done   Hepatitis C Screening  Never done   INFLUENZA VACCINE  11/17/2023 (Originally 03/20/2023)   COVID-19 Vaccine (1 - 2024-25 season) 04/20/2024 (Originally 04/20/2023)   DTaP/Tdap/Td (2 - Td or Tdap) 06/09/2026   HPV VACCINES  Aged Out    Patient Care Team: Mylie Mccurley, Monico Blitz, DO as PCP - General (Family Medicine)  Review of Systems  Constitutional:  Negative for chills and fever.  Respiratory: Negative.  Negative for cough, shortness of breath and wheezing.   Cardiovascular:  Negative for chest pain, palpitations and leg swelling.  Neurological:  Negative for weakness and headaches.        Objective    BP (!) 143/88 (BP Location: Right Arm, Patient Position: Sitting, Cuff Size: Normal)   Pulse 85   Resp 18   Ht 5\' 6"  (1.676 m)   Wt 159 lb 6.4 oz (72.3 kg)   SpO2 99%   BMI 25.73 kg/m     Physical Exam Vitals and nursing note reviewed.  Constitutional:      General: He is not in acute distress.    Appearance: Normal appearance.  HENT:     Head: Normocephalic and atraumatic.  Eyes:     General: No scleral icterus.    Conjunctiva/sclera: Conjunctivae normal.  Cardiovascular:     Rate and Rhythm: Normal rate.  Pulmonary:     Effort: Pulmonary effort is normal.  Neurological:     Mental Status: He is alert and oriented to person, place, and time. Mental status is at baseline.  Psychiatric:        Mood and Affect: Mood normal.        Behavior: Behavior normal.     Depression Screen    09/17/2023    2:04 PM  PHQ 2/9 Scores  PHQ - 2 Score 0  PHQ- 9 Score 0   No results found for any visits on 09/17/23.  Assessment & Plan     Essential hypertension Assessment & Plan: Ongoing elevated blood pressure since the accident. No prior hypertension management. Decision to start medication due to persistent high readings. Discussed importance of home monitoring and benefits of losartan in managing hypertension, reducing stroke and heart attack risk. Advised to  monitor for side effects such as dizziness or lightheadedness. - Start losartan - Follow up in four weeks to monitor blood pressure - Advise to obtain a blood pressure cuff and keep a daily log - Bring blood pressure cuff to next appointment for calibration  Orders: -     Losartan Potassium; Take 1 tablet (25 mg total) by mouth daily.  Dispense: 30 tablet; Refill: 1  Establishing care with new doctor, encounter for Assessment & Plan: Discussion about influenza vaccination. Has not received flu shot this season. Advised that flu shot can help prevent influenza, especially important given bronchitis history. - Recommend influenza vaccination once bronchitis symptoms improve   Bronchitis, subacute Assessment & Plan: Persistent cough since  before Thanksgiving, initially diagnosed as bronchitis. Symptoms include morning cough, occasional night awakenings, and frequent albuterol use. Considering differential diagnosis of asthma or COPD. Discussed benefits of pulmonary function test and methacholine challenge for asthma diagnosis. Explained methacholine challenge involves inhaling medication to trigger asthma symptoms. Discussed Symbicort inhaler to reduce symptoms and importance of albuterol for exercise-induced symptoms. - Order pulmonary function test - Order methacholine challenge - Refill albuterol inhaler - Prescribe Symbicort inhaler (budesonide/formoterol) for twice daily use - Advise to keep albuterol available for exercise-induced symptoms  Orders: -     Budesonide-Formoterol Fumarate; Inhale 2 puffs into the lungs 2 (two) times daily.  Dispense: 1 each; Refill: 12  Chronic cough Assessment & Plan: Addressed as noted above.  Orders: -     Albuterol Sulfate HFA; Inhale 1-2 puffs into the lungs every 4 (four) hours as needed for wheezing or shortness of breath.  Dispense: 18 g; Refill: 1 -     Pulmonary Function Test; Future -     Pulmonary Function Test; Future -      Budesonide-Formoterol Fumarate; Inhale 2 puffs into the lungs 2 (two) times daily.  Dispense: 1 each; Refill: 12  Flexor tendon laceration of right wrist with open wound, subsequent encounter Assessment & Plan: Small lump in the right wrist distal and adjacent to the surgical incision, which was first noticed after resuming gym workouts. No pain or functional limitation. Likely related to healing from surgery six months ago. Advised monitoring for changes or worsening symptoms. - Monitor for changes or worsening symptoms - Refer to a local specialist per patient request due to excessive travel time to follow up with original surgeon.  Orders: -     Ambulatory referral to Plastic Surgery  Injury of right radial artery, subsequent encounter -     Ambulatory referral to Plastic Surgery  Injury of right ulnar artery, subsequent encounter -     Ambulatory referral to Plastic Surgery  Laceration of right upper extremity, subsequent encounter   Follow-up - Follow up in four weeks for blood pressure check and review of pulmonary function test results.  Return in about 4 weeks (around 10/15/2023) for CPE, HTN.     I discussed the assessment and treatment plan with the patient  The patient was provided an opportunity to ask questions and all were answered. The patient agreed with the plan and demonstrated an understanding of the instructions.   The patient was advised to call back or seek an in-person evaluation if the symptoms worsen or if the condition fails to improve as anticipated.    Sherlyn Hay, DO  [facility] [phone] (phone) [phone] (fax)  [facility]

## 2023-09-18 NOTE — Addendum Note (Signed)
Addended by: Jacquenette Shone on: 09/18/2023 07:55 AM   Modules accepted: Orders

## 2023-10-02 DIAGNOSIS — M25531 Pain in right wrist: Secondary | ICD-10-CM | POA: Diagnosis not present

## 2023-10-15 ENCOUNTER — Encounter: Payer: Self-pay | Admitting: Family Medicine

## 2023-10-15 ENCOUNTER — Ambulatory Visit (INDEPENDENT_AMBULATORY_CARE_PROVIDER_SITE_OTHER): Payer: Medicaid Other | Admitting: Family Medicine

## 2023-10-15 VITALS — BP 140/100 | HR 70 | Resp 16 | Ht 66.0 in | Wt 163.8 lb

## 2023-10-15 DIAGNOSIS — Z114 Encounter for screening for human immunodeficiency virus [HIV]: Secondary | ICD-10-CM

## 2023-10-15 DIAGNOSIS — Z0001 Encounter for general adult medical examination with abnormal findings: Secondary | ICD-10-CM | POA: Diagnosis not present

## 2023-10-15 DIAGNOSIS — Z0000 Encounter for general adult medical examination without abnormal findings: Secondary | ICD-10-CM

## 2023-10-15 DIAGNOSIS — Z3169 Encounter for other general counseling and advice on procreation: Secondary | ICD-10-CM | POA: Insufficient documentation

## 2023-10-15 DIAGNOSIS — Z1159 Encounter for screening for other viral diseases: Secondary | ICD-10-CM

## 2023-10-15 DIAGNOSIS — I10 Essential (primary) hypertension: Secondary | ICD-10-CM

## 2023-10-15 MED ORDER — LOSARTAN POTASSIUM 50 MG PO TABS: 50.0000 mg | ORAL_TABLET | Freq: Every day | ORAL | 1 refills | Status: DC

## 2023-10-15 NOTE — Assessment & Plan Note (Signed)
 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 Patient is 38 years old and has been tracking ovulation and fertile periods. Advised to have intercourse every other day during fertile period to increase semen concentration. Explained that 85% of couples conceive within the first year of trying. Discussed referral to urology for further evaluation if desired. - Refer to urology for further evaluation/recommendations

## 2023-10-15 NOTE — Patient Instructions (Signed)

## 2023-10-15 NOTE — Assessment & Plan Note (Addendum)
 Physical exam overall unremarkable except as noted above. Routine lab work ordered as noted.  Recommended screening for hepatitis C and HIV as part of routine adult health maintenance. - Order cholesterol panel - Order metabolic panel (kidney function, liver function, electrolytes) - Screen for hepatitis C and HIV.

## 2023-10-15 NOTE — Assessment & Plan Note (Signed)
 Hypertension with blood pressure readings ranging from 130/88 to 155/112. Reports occasional missed doses of antihypertensive medication. No associated symptoms such as chest pain, shortness of breath, dizziness, or vision changes. Headaches reported twice a week, consistent with previous history. Discussed increasing losartan dosage to better control blood pressure. Informed that Omron is a reliable brand for home monitoring. Advised to follow up virtually or in 4 weeks to reassess blood pressure control, with the option to cancel if readings stabilize to 120s/70s. - Increase losartan dosage - Monitor blood pressure at home with cuff validated this visit. - Follow up in-office virtually or in 4 weeks to reassess blood pressure control - Provide blood pressure target levels

## 2023-10-15 NOTE — Progress Notes (Signed)
 Established patient visit   Patient: Willie Riley   DOB: 10-07-1985   38 y.o. Male  MRN: [id number] Visit Date: 10/15/2023  Today's healthcare provider: Sherlyn Hay, DO   Chief Complaint  Patient presents with   Annual Exam   Hypertension   Subjective    Hypertension Associated symptoms include headaches (intermittent; up to a week). Pertinent negatives include no chest pain, palpitations or shortness of breath.   Willie Riley is a 38 year old male with hypertension who presents for blood pressure management and evaluation of infertility.  He is here for management of his hypertension. His blood pressure was elevated this morning, but he did not take his medication. He usually takes his medication in the morning but sometimes forgets and takes it later in the day. He has been monitoring his blood pressure, which has been consistently elevated, with readings ranging from 130/88 to as high as 155/112. No chest pain, shortness of breath, dizziness, lightheadedness, or vision changes. However, he has headaches sometimes twice a week, which is consistent with his previous experiences.  He is also seeking evaluation for infertility, having been actively trying to conceive for six months without success. His partner is tracking ovulation and timing intercourse during her most fertile period. He is interested in further testing to determine if the issue is with him or his partner.     Medications: Outpatient Medications Prior to Visit  Medication Sig   albuterol (VENTOLIN HFA) 108 (90 Base) MCG/ACT inhaler Inhale 1-2 puffs into the lungs every 4 (four) hours as needed for wheezing or shortness of breath.   aspirin EC 81 MG tablet Take 81 mg by mouth daily. Swallow whole.   budesonide-formoterol (SYMBICORT) 80-4.5 MCG/ACT inhaler Inhale 2 puffs into the lungs 2 (two) times daily.   [DISCONTINUED] losartan (COZAAR) 25 MG tablet Take 1 tablet (25 mg total) by mouth daily.   No  facility-administered medications prior to visit.    Review of Systems  Constitutional:  Negative for appetite change, chills, fatigue and fever.  HENT:  Negative for congestion, ear pain, hearing loss, nosebleeds and trouble swallowing.   Eyes:  Negative for pain and visual disturbance.  Respiratory: Negative.  Negative for cough, chest tightness, shortness of breath and wheezing.   Cardiovascular:  Negative for chest pain, palpitations and leg swelling.  Gastrointestinal:  Negative for abdominal pain, blood in stool, constipation, diarrhea, nausea and vomiting.  Genitourinary:  Negative for dysuria and flank pain.  Musculoskeletal:  Negative for arthralgias, back pain, joint swelling, myalgias and neck stiffness.  Skin:  Negative for color change, rash and wound.  Neurological:  Positive for headaches (intermittent; up to a week). Negative for dizziness, tremors, seizures, speech difficulty, weakness and light-headedness.  Psychiatric/Behavioral:  Negative for behavioral problems, confusion, decreased concentration, dysphoric mood and sleep disturbance. The patient is not nervous/anxious.   All other systems reviewed and are negative.       Objective    BP (!) 140/100 (BP Location: Right Arm, Patient Position: Sitting, Cuff Size: Normal) Comment: Home BP cuff  Pulse 70   Resp 16   Ht 5\' 6"  (1.676 m)   Wt 163 lb 12.8 oz (74.3 kg)   SpO2 100%   BMI 26.44 kg/m     Physical Exam Vitals and nursing note reviewed.  Constitutional:      General: He is awake. He is not in acute distress.    Appearance: Normal appearance.  HENT:  Head: Normocephalic and atraumatic.     Right Ear: Tympanic membrane, ear canal and external ear normal.     Left Ear: Tympanic membrane, ear canal and external ear normal.     Nose: Nose normal.     Mouth/Throat:     Mouth: Mucous membranes are moist.     Pharynx: Oropharynx is clear. No oropharyngeal exudate or posterior oropharyngeal erythema.   Eyes:     General: No scleral icterus.    Extraocular Movements: Extraocular movements intact.     Conjunctiva/sclera: Conjunctivae normal.     Pupils: Pupils are equal, round, and reactive to light.  Neck:     Thyroid: No thyromegaly or thyroid tenderness.  Cardiovascular:     Rate and Rhythm: Normal rate and regular rhythm.     Pulses: Normal pulses.     Heart sounds: Normal heart sounds.  Pulmonary:     Effort: Pulmonary effort is normal. No tachypnea, bradypnea or respiratory distress.     Breath sounds: Normal breath sounds. No stridor. No wheezing, rhonchi or rales.  Abdominal:     General: Bowel sounds are normal. There is no distension.     Palpations: Abdomen is soft. There is no mass.     Tenderness: There is no abdominal tenderness. There is no guarding.     Hernia: No hernia is present.  Musculoskeletal:     Cervical back: Normal range of motion and neck supple.     Right lower leg: No edema.     Left lower leg: No edema.  Lymphadenopathy:     Cervical: No cervical adenopathy.  Skin:    General: Skin is warm and dry.  Neurological:     Mental Status: He is alert and oriented to person, place, and time. Mental status is at baseline.  Psychiatric:        Mood and Affect: Mood normal.        Behavior: Behavior normal.      No results found for any visits on 10/15/23.  Assessment & Plan    Annual physical exam Assessment & Plan: Physical exam overall unremarkable except as noted above. Routine lab work ordered as noted.  Recommended screening for hepatitis C and HIV as part of routine adult health maintenance. - Order cholesterol panel - Order metabolic panel (kidney function, liver function, electrolytes) - Screen for hepatitis C and HIV.   Essential hypertension Assessment & Plan: Hypertension with blood pressure readings ranging from 130/88 to 155/112. Reports occasional missed doses of antihypertensive medication. No associated symptoms such as chest  pain, shortness of breath, dizziness, or vision changes. Headaches reported twice a week, consistent with previous history. Discussed increasing losartan dosage to better control blood pressure. Informed that Omron is a reliable brand for home monitoring. Advised to follow up virtually or in 4 weeks to reassess blood pressure control, with the option to cancel if readings stabilize to 120s/70s. - Increase losartan dosage - Monitor blood pressure at home with cuff validated this visit. - Follow up in-office virtually or in 4 weeks to reassess blood pressure control - Provide blood pressure target levels  Orders: -     Losartan Potassium; Take 1 tablet (50 mg total) by mouth daily.  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 Patient is 38 years old and has been tracking ovulation and fertile  periods. Advised to have intercourse every other day during fertile period to increase semen concentration. Explained that 85% of couples conceive within the first year of trying. Discussed referral to urology for further evaluation if desired. - Refer to urology for further evaluation/recommendations  Orders: -     Ambulatory referral to Urology  Encounter for screening for HIV -     HIV Antibody (routine testing w rflx)  Encounter for hepatitis C screening test for low risk patient -     HCV Ab w Reflex to Quant PCR    Return in about 4 weeks (around 11/12/2023) for HTN.      I discussed the assessment and treatment plan with the patient  The patient was provided an opportunity to ask questions and all were answered. The patient agreed with the plan and demonstrated an understanding of the instructions.   The patient was advised to call back or seek an in-person evaluation if the symptoms worsen or if the condition fails to improve as  anticipated.    Sherlyn Hay, DO  [facility] [phone] (phone) [phone] (fax)  [facility]

## 2023-10-18 LAB — COMPREHENSIVE METABOLIC PANEL
ALT: 29 IU/L (ref 0–44)
AST: 17 IU/L (ref 0–40)
Albumin: 4.4 g/dL (ref 4.1–5.1)
Alkaline Phosphatase: 89 IU/L (ref 44–121)
BUN/Creatinine Ratio: 11 (ref 9–20)
BUN: 11 mg/dL (ref 6–20)
Bilirubin Total: 0.4 mg/dL (ref 0.0–1.2)
CO2: 22 mmol/L (ref 20–29)
Calcium: 9.5 mg/dL (ref 8.7–10.2)
Chloride: 104 mmol/L (ref 96–106)
Creatinine, Ser: 1 mg/dL (ref 0.76–1.27)
Globulin, Total: 2.5 g/dL (ref 1.5–4.5)
Glucose: 82 mg/dL (ref 70–99)
Potassium: 4.3 mmol/L (ref 3.5–5.2)
Sodium: 142 mmol/L (ref 134–144)
Total Protein: 6.9 g/dL (ref 6.0–8.5)
eGFR: 99 mL/min/{1.73_m2} (ref >59)

## 2023-10-18 LAB — LIPID PANEL
Chol/HDL Ratio: 4.1 ratio (ref 0.0–5.0)
Cholesterol, Total: 160 mg/dL (ref 100–199)
HDL: 39 mg/dL — ABNORMAL LOW (ref >39)
LDL Chol Calc (NIH): 102 mg/dL — ABNORMAL HIGH (ref 0–99)
Triglycerides: 103 mg/dL (ref 0–149)
VLDL Cholesterol Cal: 19 mg/dL (ref 5–40)

## 2023-10-18 LAB — HCV INTERPRETATION

## 2023-10-18 LAB — HCV AB W REFLEX TO QUANT PCR: HCV Ab: NONREACTIVE

## 2023-10-18 LAB — HIV ANTIBODY (ROUTINE TESTING W REFLEX): HIV Screen 4th Generation wRfx: NONREACTIVE

## 2023-11-12 ENCOUNTER — Other Ambulatory Visit: Payer: Self-pay | Admitting: Family Medicine

## 2023-11-12 DIAGNOSIS — I10 Essential (primary) hypertension: Secondary | ICD-10-CM

## 2023-11-13 NOTE — Telephone Encounter (Signed)
 Requested Prescriptions  Refused Prescriptions Disp Refills   losartan (COZAAR) 25 MG tablet [Pharmacy Med Name: LOSARTAN POTASSIUM 25 MG TAB] 30 tablet 1    Sig: TAKE 1 TABLET (25 MG TOTAL) BY MOUTH DAILY.     Cardiovascular:  Angiotensin Receptor Blockers Failed - 11/13/2023 12:21 PM      Failed - Last BP in normal range    BP Readings from Last 1 Encounters:  10/15/23 (!) 140/100         Passed - Cr in normal range and within 180 days    Creatinine, Ser  Date Value Ref Range Status  10/15/2023 1.00 0.76 - 1.27 mg/dL Final         Passed - K in normal range and within 180 days    Potassium  Date Value Ref Range Status  10/15/2023 4.3 3.5 - 5.2 mmol/L Final         Passed - Patient is not pregnant      Passed - Valid encounter within last 6 months    Recent Outpatient Visits           4 weeks ago Annual physical exam   [facility], Monico Blitz, DO       Future Appointments             In 4 days Pardue, Monico Blitz, DO [facility]

## 2023-11-17 ENCOUNTER — Ambulatory Visit: Payer: Medicaid Other | Admitting: Family Medicine

## 2023-12-11 ENCOUNTER — Other Ambulatory Visit: Payer: Self-pay | Admitting: Family Medicine

## 2023-12-11 DIAGNOSIS — I10 Essential (primary) hypertension: Secondary | ICD-10-CM

## 2024-01-26 DIAGNOSIS — M25531 Pain in right wrist: Secondary | ICD-10-CM | POA: Diagnosis not present

## 2024-05-24 ENCOUNTER — Ambulatory Visit: Attending: Obstetrics and Gynecology

## 2024-05-24 DIAGNOSIS — Z31441 Encounter for testing of male partner of patient with recurrent pregnancy loss: Secondary | ICD-10-CM | POA: Diagnosis not present

## 2024-06-08 LAB — CHROMOSOME, BLOOD, ROUTINE
Cells Analyzed: 20
Cells Counted: 20
Cells Karyotyped: 2
GTG Band Resolution Achieved: 500

## 2024-06-09 ENCOUNTER — Telehealth: Payer: Self-pay

## 2024-06-09 NOTE — Telephone Encounter (Signed)
   I spoke with the patient to return his karyotype results.   They returned as normal 46, XY. No balanced translocation was detected that increases the risk for miscarriage or infertility. This does not exclude subtle rearrangements that would not be detected by a karyotype.   Patient's partner Hnhue Rcom (DOB 02/19/1991) also had a karyotype that was normal 46, XX. We did not disclose these to the patient but called the partner directly to return her results.   Lauraine Bodily, MS, [facility] Certified Genetic Counselor [facility] [phone]

## 2024-06-11 ENCOUNTER — Ambulatory Visit: Admitting: Family Medicine

## 2024-09-22 ENCOUNTER — Other Ambulatory Visit: Payer: Self-pay | Admitting: Family Medicine

## 2024-09-22 DIAGNOSIS — I10 Essential (primary) hypertension: Secondary | ICD-10-CM

## 2024-09-22 NOTE — Telephone Encounter (Signed)
 Copied from CRM [id number]. Topic: Clinical - Medication Refill >> Sep 22, 2024  1:44 PM Tiffini S wrote: Medication: losartan  (COZAAR ) 50 MG tablet  Has the patient contacted their pharmacy? No (Agent: If no, request that the patient contact the pharmacy for the refill. If patient does not wish to contact the pharmacy document the reason why and proceed with request.) (Agent: If yes, when and what did the pharmacy advise?)  This is the patient's preferred pharmacy:  [id number] - [address] [place] [address] Phone: [phone] Fax: [phone]  Is this the correct pharmacy for this prescription? Yes If no, delete pharmacy and type the correct one.   Has the prescription been filled recently? Yes  Is the patient out of the medication? Yes  Has the patient been seen for an appointment in the last year OR does the patient have an upcoming appointment? Yes  Can we respond through MyChart? No, please call  [phone]  Agent: Please be advised that Rx refills may take up to 3 business days. We ask that you follow-up with your pharmacy.

## 2024-09-24 NOTE — Telephone Encounter (Signed)
 Requested medications are due for refill today.  yes  Requested medications are on the active medications list.  yes  Last refill. 12/11/2023 #30 1 rf  Future visit scheduled.   yes  Notes to clinic.  Expired labs    Requested Prescriptions  Pending Prescriptions Disp Refills   losartan  (COZAAR ) 50 MG tablet 30 tablet 1    Sig: Take 1 tablet (50 mg total) by mouth daily.     Cardiovascular:  Angiotensin Receptor Blockers Failed - 09/24/2024 10:59 AM      Failed - Cr in normal range and within 180 days    Creatinine, Ser  Date Value Ref Range Status  10/15/2023 1.00 0.76 - 1.27 mg/dL Final         Failed - K in normal range and within 180 days    Potassium  Date Value Ref Range Status  10/15/2023 4.3 3.5 - 5.2 mmol/L Final         Failed - Last BP in normal range    BP Readings from Last 1 Encounters:  10/15/23 (!) 140/100         Failed - Valid encounter within last 6 months    Recent Outpatient Visits           11 months ago Annual physical exam   [facility], Lauraine SAILOR, DO              Passed - Patient is not pregnant

## 2024-10-20 ENCOUNTER — Encounter: Admitting: Family Medicine
